# Patient Record
Sex: Female | Born: 1995 | Race: Black or African American | Hispanic: No | Marital: Married | Smoking: Former smoker
Health system: Southern US, Community
[De-identification: ages and names within clinical notes are randomized; demographics above are authoritative.]

## PROBLEM LIST (undated history)

## (undated) ENCOUNTER — Inpatient Hospital Stay (HOSPITAL_COMMUNITY): Payer: Self-pay

## (undated) DIAGNOSIS — N390 Urinary tract infection, site not specified: Secondary | ICD-10-CM

## (undated) HISTORY — DX: Urinary tract infection, site not specified: N39.0

## (undated) HISTORY — PX: NO PAST SURGERIES: SHX2092

---

## 2018-03-02 ENCOUNTER — Emergency Department (HOSPITAL_COMMUNITY): Payer: BLUE CROSS/BLUE SHIELD

## 2018-03-02 ENCOUNTER — Encounter (HOSPITAL_COMMUNITY): Payer: Self-pay | Admitting: *Deleted

## 2018-03-02 ENCOUNTER — Emergency Department (HOSPITAL_COMMUNITY)
Admission: EM | Admit: 2018-03-02 | Discharge: 2018-03-02 | Disposition: A | Discharge status: Home / Self Care | Payer: BLUE CROSS/BLUE SHIELD | Attending: Emergency Medicine | Admitting: Emergency Medicine

## 2018-03-02 ENCOUNTER — Other Ambulatory Visit: Payer: Self-pay

## 2018-03-02 DIAGNOSIS — R102 Pelvic and perineal pain: Secondary | ICD-10-CM | POA: Insufficient documentation

## 2018-03-02 DIAGNOSIS — O9989 Other specified diseases and conditions complicating pregnancy, childbirth and the puerperium: Secondary | ICD-10-CM | POA: Diagnosis present

## 2018-03-02 DIAGNOSIS — O208 Other hemorrhage in early pregnancy: Secondary | ICD-10-CM | POA: Insufficient documentation

## 2018-03-02 DIAGNOSIS — O418X10 Other specified disorders of amniotic fluid and membranes, first trimester, not applicable or unspecified: Secondary | ICD-10-CM

## 2018-03-02 DIAGNOSIS — N760 Acute vaginitis: Secondary | ICD-10-CM

## 2018-03-02 DIAGNOSIS — Z3A01 Less than 8 weeks gestation of pregnancy: Secondary | ICD-10-CM | POA: Diagnosis not present

## 2018-03-02 DIAGNOSIS — Z87891 Personal history of nicotine dependence: Secondary | ICD-10-CM | POA: Diagnosis not present

## 2018-03-02 DIAGNOSIS — B9689 Other specified bacterial agents as the cause of diseases classified elsewhere: Secondary | ICD-10-CM

## 2018-03-02 DIAGNOSIS — O2331 Infections of other parts of urinary tract in pregnancy, first trimester: Secondary | ICD-10-CM | POA: Insufficient documentation

## 2018-03-02 DIAGNOSIS — O468X1 Other antepartum hemorrhage, first trimester: Secondary | ICD-10-CM

## 2018-03-02 LAB — CBC WITH DIFFERENTIAL/PLATELET
Abs Immature Granulocytes: 0.02 10*3/uL (ref 0.00–0.07)
Basophils Absolute: 0.1 10*3/uL (ref 0.0–0.1)
Basophils Relative: 1 %
EOS PCT: 3 %
Eosinophils Absolute: 0.2 10*3/uL (ref 0.0–0.5)
HCT: 42 % (ref 36.0–46.0)
Hemoglobin: 12.6 g/dL (ref 12.0–15.0)
Immature Granulocytes: 0 %
Lymphocytes Relative: 26 %
Lymphs Abs: 2.3 10*3/uL (ref 0.7–4.0)
MCH: 25.6 pg — AB (ref 26.0–34.0)
MCHC: 30 g/dL (ref 30.0–36.0)
MCV: 85.4 fL (ref 80.0–100.0)
MONO ABS: 1.3 10*3/uL — AB (ref 0.1–1.0)
Monocytes Relative: 15 %
Neutro Abs: 4.9 10*3/uL (ref 1.7–7.7)
Neutrophils Relative %: 55 %
Platelets: 244 10*3/uL (ref 150–400)
RBC: 4.92 MIL/uL (ref 3.87–5.11)
RDW: 13.3 % (ref 11.5–15.5)
WBC: 8.8 10*3/uL (ref 4.0–10.5)
nRBC: 0 % (ref 0.0–0.2)

## 2018-03-02 LAB — WET PREP, GENITAL
SPERM: NONE SEEN
Trich, Wet Prep: NONE SEEN
Yeast Wet Prep HPF POC: NONE SEEN

## 2018-03-02 LAB — BASIC METABOLIC PANEL
Anion gap: 10 (ref 5–15)
BUN: 5 mg/dL — ABNORMAL LOW (ref 6–20)
CHLORIDE: 103 mmol/L (ref 98–111)
CO2: 21 mmol/L — ABNORMAL LOW (ref 22–32)
Calcium: 9 mg/dL (ref 8.9–10.3)
Creatinine, Ser: 0.76 mg/dL (ref 0.44–1.00)
GFR calc Af Amer: >60 mL/min (ref >60)
GFR calc non Af Amer: >60 mL/min (ref >60)
Glucose, Bld: 98 mg/dL (ref 70–99)
Potassium: 3.4 mmol/L — ABNORMAL LOW (ref 3.5–5.1)
Sodium: 134 mmol/L — ABNORMAL LOW (ref 135–145)

## 2018-03-02 LAB — URINALYSIS, ROUTINE W REFLEX MICROSCOPIC
BILIRUBIN URINE: NEGATIVE
Glucose, UA: NEGATIVE mg/dL
Ketones, ur: NEGATIVE mg/dL
Leukocytes, UA: NEGATIVE
Nitrite: NEGATIVE
Protein, ur: NEGATIVE mg/dL
Specific Gravity, Urine: 1.008 (ref 1.005–1.030)
pH: 6 (ref 5.0–8.0)

## 2018-03-02 LAB — ABO/RH: ABO/RH(D): A POS

## 2018-03-02 LAB — HCG, QUANTITATIVE, PREGNANCY: hCG, Beta Chain, Quant, S: 31614 m[IU]/mL — ABNORMAL HIGH (ref <5)

## 2018-03-02 MED ORDER — METRONIDAZOLE 0.75 % VA GEL: 1.0000 | Freq: Every day | VAGINAL | 0 refills | Status: AC

## 2018-03-02 NOTE — ED Notes (Signed)
Pt still in ultrasound.

## 2018-03-02 NOTE — Discharge Instructions (Signed)
Start taking a prenatal vitamin every day. Follow-up with OB/GYN for further management of your pregnancy. Take metrogel daily for the next 5 days.  Return to the ER or the [facility] if you develop severe worsening pain, bleeding, or any new, worsening, or concerning symptoms.

## 2018-03-02 NOTE — ED Triage Notes (Signed)
Patient states she was in her kitchen last pm and slipped and fell hitting her head no loc , states she had a heachache and took some Ibuprofen, states she woke up this am with vaginal bleeding and lower abd. Cramping. States she took  A home preg. Test last week  And it was postivive

## 2018-03-02 NOTE — ED Provider Notes (Signed)
[facility] EMERGENCY DEPARTMENT Provider Note   CSN: [id number] Arrival date & time: 03/02/18  4098     History   Chief Complaint Chief Complaint  Patient presents with  . Fall  . Vaginal Bleeding  . Possible Pregnancy    HPI Jennifer Bush is a 23 y.o. female presenting for evaluation of vaginal bleeding, fall, and cramping.  Patient states last night she slipped while wearing socks on the floor, fell back and hit the back of her head.  She denies loss of consciousness.  She is not on blood thinners.  She took some ibuprofen, had improvement of her headache.  She currently denies any head pain.  She denies vision changes, slurred speech, neck pain, back pain, numbness/tingling, nausea, or vomiting. Patient states she took 2 home pregnancy tests on the third, both of which were positive.  When she took a pregnancy test just before Christmas, it was negative.  This is patient's first pregnancy.  Today, she had some lower abdominal cramping.  Cramping is intermittent, and improving.  She has not taken anything for it.  She also woke up with vaginal bleeding.  She states it was moderate, although improved prior to coming to the ER.  She denies fevers, chills, chest pain, shortness breath, upper abdominal pain, or urinary symptoms.  She has no medical problems, takes no medications daily.  She has not followed up with an OB/GYN, but was working on scheduling a follow-up appointment.  HPI  History reviewed. No pertinent past medical history.  There are no active problems to display for this patient.   History reviewed. No pertinent surgical history.   OB History   No obstetric history on file.      Home Medications    Prior to Admission medications   Medication Sig Start Date End Date Taking? Authorizing Provider  metroNIDAZOLE (METROGEL VAGINAL) 0.75 % vaginal gel Place 1 Applicatorful vaginally daily for 5 days. 03/02/18 03/07/18  Advik Weatherspoon, PA-C     Family History No family history on file.  Social History Social History   Tobacco Use  . Smoking status: Former Games developer  . Smokeless tobacco: Never Used  Substance Use Topics  . Alcohol use: Not Currently  . Drug use: Never     Allergies   Patient has no allergy information on record.   Review of Systems Review of Systems  Genitourinary: Positive for pelvic pain (Low abdominal cramping, improving) and vaginal bleeding (Improving). Negative for dysuria, frequency, hematuria and vaginal discharge.  Neurological: Positive for headaches (Resolved).  All other systems reviewed and are negative.    Physical Exam Updated Vital Signs BP (!) 108/50 (BP Location: Right Arm)   Pulse 77   Temp 98.6 F (37 C) (Oral)   Resp 15   Ht 5\' 3"  (1.6 m)   Wt 72.6 kg   SpO2 100%   BMI 28.34 kg/m   Physical Exam Vitals signs and nursing note reviewed. Exam conducted with a chaperone present.  Constitutional:      General: She is not in acute distress.    Appearance: She is well-developed.     Comments: Sitting comfortably in the bed in no acute distress  HENT:     Head: Normocephalic and atraumatic.     Comments: No signs of head trauma.  No lacerations or injuries. Eyes:     Extraocular Movements: Extraocular movements intact.     Conjunctiva/sclera: Conjunctivae normal.     Pupils: Pupils are equal, round, and  reactive to light.     Comments: EOMI and PERRLA.  Neck:     Musculoskeletal: Normal range of motion and neck supple.     Comments: No tenderness palpation midline C-spine. Cardiovascular:     Rate and Rhythm: Normal rate and regular rhythm.  Pulmonary:     Effort: Pulmonary effort is normal. No respiratory distress.     Breath sounds: Normal breath sounds. No wheezing.  Abdominal:     General: Bowel sounds are normal. There is no distension.     Palpations: Abdomen is soft. There is no mass.     Tenderness: There is no abdominal tenderness. There is no guarding  or rebound.     Comments: No tenderness palpation the abdomen.  Soft without rigidity, guarding,, distention.  Negative rebound.  Genitourinary:    Exam position: Supine.     Labia:        Right: No tenderness or lesion.        Left: No tenderness or lesion.      Vagina: Normal.     Cervix: Cervical bleeding present. No cervical motion tenderness or friability.     Uterus: Not fixed and not tender.      Adnexa:        Right: No tenderness.         Left: No tenderness.       Comments: Minimal to no bleeding coming from the cervix.  No adnexal tenderness or CMT.  No obvious discharge. Musculoskeletal: Normal range of motion.  Skin:    General: Skin is warm and dry.  Neurological:     Mental Status: She is alert and oriented to person, place, and time.      ED Treatments / Results  Labs (all labs ordered are listed, but only abnormal results are displayed) Labs Reviewed  WET PREP, GENITAL - Abnormal; Notable for the following components:      Result Value   Clue Cells Wet Prep HPF POC PRESENT (*)    WBC, Wet Prep HPF POC FEW (*)    All other components within normal limits  URINALYSIS, ROUTINE W REFLEX MICROSCOPIC - Abnormal; Notable for the following components:   APPearance HAZY (*)    Hgb urine dipstick MODERATE (*)    Bacteria, UA RARE (*)    All other components within normal limits  CBC WITH DIFFERENTIAL/PLATELET - Abnormal; Notable for the following components:   MCH 25.6 (*)    Monocytes Absolute 1.3 (*)    All other components within normal limits  BASIC METABOLIC PANEL - Abnormal; Notable for the following components:   Sodium 134 (*)    Potassium 3.4 (*)    CO2 21 (*)    BUN 5 (*)    All other components within normal limits  HCG, QUANTITATIVE, PREGNANCY - Abnormal; Notable for the following components:   hCG, Beta Chain, Quant, S 31,614 (*)    All other components within normal limits  URINE CULTURE  ABO/RH  GC/CHLAMYDIA PROBE AMP () NOT AT [facility]     EKG None  Radiology US Ob Comp < 14 Wks  Result Date: 03/02/2018 CLINICAL DATA:  Vaginal bleeding for 1 day in early pregnancy EXAM: OBSTETRIC <14 WK [place] AND TRANSVAGINAL OB US TECHNIQUE: Both transabdominal and transvaginal ultrasound examinations were performed for complete evaluation of the gestation as well as the maternal uterus, adnexal regions, and pelvic cul-de-sac. Transvaginal technique was performed to assess early pregnancy. COMPARISON:  None FINDINGS: Intrauterine gestational sac:  Present, single Yolk sac:  Present Embryo:  Present Cardiac Activity: Present Heart Rate: Adequate M-mode tracing of the fetal cardiac rate could not be obtained CRL:  3 mm   5 w   6 d                  [place] EDC: 11/07/2018 Subchorionic hemorrhage: Moderate to large subchronic hemorrhage present Maternal uterus/adnexae: Ovaries unremarkable. No free fluid. No adnexal masses. IMPRESSION: Single live intrauterine gestation is identified at 5 weeks 6 days EGA by crown-rump length. Moderate to large subchronic hemorrhage. Electronically Signed   By: Ulyses Southward M.D.   On: 03/02/2018 12:21   US Ob Transvaginal  Result Date: 03/02/2018 CLINICAL DATA:  Vaginal bleeding for 1 day in early pregnancy EXAM: OBSTETRIC <14 WK [place] AND TRANSVAGINAL OB US TECHNIQUE: Both transabdominal and transvaginal ultrasound examinations were performed for complete evaluation of the gestation as well as the maternal uterus, adnexal regions, and pelvic cul-de-sac. Transvaginal technique was performed to assess early pregnancy. COMPARISON:  None FINDINGS: Intrauterine gestational sac: Present, single Yolk sac:  Present Embryo:  Present Cardiac Activity: Present Heart Rate: Adequate M-mode tracing of the fetal cardiac rate could not be obtained CRL:  3 mm   5 w   6 d                  [place] EDC: 11/07/2018 Subchorionic hemorrhage: Moderate to large subchronic hemorrhage present Maternal uterus/adnexae: Ovaries unremarkable. No free fluid. No adnexal  masses. IMPRESSION: Single live intrauterine gestation is identified at 5 weeks 6 days EGA by crown-rump length. Moderate to large subchronic hemorrhage. Electronically Signed   By: Ulyses Southward M.D.   On: 03/02/2018 12:21    Procedures Procedures (including critical care time)  Medications Ordered in ED Medications - No data to display   Initial Impression / Assessment and Plan / ED Course  I have reviewed the triage vital signs and the nursing notes.  Pertinent labs & imaging results that were available during my care of the patient were reviewed by me and considered in my medical decision making (see chart for details).     Presenting for evaluation after a fall, vaginal bleeding, and cramping.  On exam, patient is neurovascularly intact.  Headache resolved.  As such, low suspicion for intracranial injury or skull fracture.  I do not believe she needs CT scan today.  Regarding patient's vaginal bleeding, cramping, and pregnancy, will obtain labs, urine, and perform pelvic exam.  Pelvic exam reassuring, no tenderness.  Lower suspicion for ectopic.  However, patient has not had a confirmed IUP on ultrasound.  As he was having pain and vaginal bleeding, will obtain ultrasound for further evaluation.  Labs reassuring, hemoglobin stable.  HCG consistent with pregnancy.  Wet prep with clue cells.  UA without infection.  Ultrasound shows a single, live, approximately 5-week, IUP.  Ultrasound also shows subchorionic hemorrhage.  Discussed findings with patient.  Discussed importance of follow-up with OB/GYN.  Discussed starting prenatal vitamins.  Patient encouraged not to take NSAIDs, use Tylenol as needed for pain.  Will give MetroGel for BV.  At this time, patient appears safe for discharge.  Return precautions given.  Patient states she understands and agrees to plan.   Final Clinical Impressions(s) / ED Diagnoses   Final diagnoses:  BV (bacterial vaginosis)  Subchorionic hemorrhage of  placenta in first trimester, single or unspecified fetus    ED Discharge Orders  Ordered    metroNIDAZOLE (METROGEL VAGINAL) 0.75 % vaginal gel  Daily     03/02/18 1232           Alveria ApleyCaccavale, Merline Perkin, PA-C 03/02/18 1654    Mesner, Barbara CowerJason, MD 03/03/18 1651

## 2018-03-02 NOTE — ED Notes (Signed)
Esignature pad not available. Pt received d/c instructions and prescription. Pt agreeable to discharge.

## 2018-03-03 ENCOUNTER — Other Ambulatory Visit: Payer: Self-pay

## 2018-03-03 ENCOUNTER — Encounter (HOSPITAL_COMMUNITY): Payer: Self-pay | Admitting: *Deleted

## 2018-03-03 ENCOUNTER — Inpatient Hospital Stay (HOSPITAL_COMMUNITY)
Admission: AD | Admit: 2018-03-03 | Discharge: 2018-03-03 | Disposition: A | Discharge status: Home / Self Care | Payer: BLUE CROSS/BLUE SHIELD | Attending: Family Medicine | Admitting: Family Medicine

## 2018-03-03 DIAGNOSIS — O468X1 Other antepartum hemorrhage, first trimester: Secondary | ICD-10-CM | POA: Diagnosis not present

## 2018-03-03 DIAGNOSIS — O209 Hemorrhage in early pregnancy, unspecified: Secondary | ICD-10-CM

## 2018-03-03 DIAGNOSIS — Z87891 Personal history of nicotine dependence: Secondary | ICD-10-CM | POA: Diagnosis not present

## 2018-03-03 DIAGNOSIS — Z3A01 Less than 8 weeks gestation of pregnancy: Secondary | ICD-10-CM | POA: Insufficient documentation

## 2018-03-03 DIAGNOSIS — O418X10 Other specified disorders of amniotic fluid and membranes, first trimester, not applicable or unspecified: Secondary | ICD-10-CM | POA: Diagnosis present

## 2018-03-03 LAB — URINALYSIS, ROUTINE W REFLEX MICROSCOPIC
BILIRUBIN URINE: NEGATIVE
Bacteria, UA: NONE SEEN
Glucose, UA: NEGATIVE mg/dL
Ketones, ur: 5 mg/dL — AB
Leukocytes, UA: NEGATIVE
Nitrite: NEGATIVE
Protein, ur: NEGATIVE mg/dL
Specific Gravity, Urine: 1.001 — ABNORMAL LOW (ref 1.005–1.030)
pH: 6 (ref 5.0–8.0)

## 2018-03-03 LAB — URINE CULTURE

## 2018-03-03 LAB — GC/CHLAMYDIA PROBE AMP (~~LOC~~) NOT AT ARMC
Chlamydia: NEGATIVE
Neisseria Gonorrhea: NEGATIVE

## 2018-03-03 MED ORDER — VITAFOL FE+ 90-1-200 & 50 MG PO CPPK: 3.0000 | ORAL_CAPSULE | Freq: Every day | ORAL | 12 refills | Status: AC

## 2018-03-03 MED ORDER — METOCLOPRAMIDE HCL 10 MG PO TABS: 10.0000 mg | ORAL_TABLET | Freq: Four times a day (QID) | ORAL | 0 refills | Status: AC

## 2018-03-03 MED ORDER — PROMETHAZINE HCL 12.5 MG PO TABS: 12.5000 mg | ORAL_TABLET | Freq: Four times a day (QID) | ORAL | 1 refills | Status: AC | PRN

## 2018-03-03 NOTE — Discharge Instructions (Signed)
Choose from one of the providers below to establish prenatal care  Center for [facility] Prenatal Care Providers          Center for [facility] Healthcare @ [facility]   Phone: [phone]  Center for [facility] @ [facility]   Phone: [phone]  [facility] For [facility] @[place]  [facility]       Phone: [phone]            Center for [facility] @ [place]     Phone: [phone]          Center for [facility] @ [employer]   Phone: [phone]  Center for [facility] @ [facility]  Phone: [phone]     Family [address] [place])  Phone: [phone]  [facility] Ob/Gyn Providers    [facility]       Phone: [phone]  [facility]    Phone: [phone]   [facility]    Phone: [phone]  [facility]    Phone: [phone]  [facility]    Phone: [phone]  [facility] Department-Family Planning       Phone: [phone]   [facility] Department-Maternity  Phone: [phone]  [facility]    Phone: [phone]  [facility]   Phone: [phone]  [facility]      Phone: [phone]  [facility]    Phone: [phone]

## 2018-03-03 NOTE — MAU Provider Note (Signed)
History     CSN: [id number]  Arrival date and time: 03/03/18 1401   First Provider Initiated Contact with Patient 03/03/18 1500      Chief Complaint  Patient presents with  . Fall   HPI  Ms.  Jennifer Bush is a 23 y.o. year old G1P0 female at [redacted]w[redacted]d weeks gestation who presents to MAU reporting vaginal bleeding with intermittent cramping today. She reports that she fell flat on her back also hitting the back of her head on Monday 03/01/2018. She began to have vaginal bleeding. She was seen at [facility] on 03/02/2018. She had an U/S and was told that she had a viable IUP sized 5.6 wks.  Past Medical History:  Diagnosis Date  . Medical history non-contributory     Past Surgical History:  Procedure Laterality Date  . NO PAST SURGERIES      Family History  Problem Relation Age of Onset  . Hypertension Father   . Hypertension Maternal Grandmother     Social History   Tobacco Use  . Smoking status: Former Games developer  . Smokeless tobacco: Never Used  Substance Use Topics  . Alcohol use: Not Currently  . Drug use: Never    Allergies: No Known Allergies  Medications Prior to Admission  Medication Sig Dispense Refill Last Dose  . metroNIDAZOLE (METROGEL VAGINAL) 0.75 % vaginal gel Place 1 Applicatorful vaginally daily for 5 days. 70 g 0     Review of Systems  Constitutional: Negative.   HENT: Negative.   Eyes: Negative.   Respiratory: Negative.   Cardiovascular: Negative.   Gastrointestinal: Negative.   Endocrine: Negative.   Genitourinary: Positive for vaginal bleeding.  Musculoskeletal: Negative.   Skin: Negative.   Allergic/Immunologic: Negative.   Neurological: Negative.   Hematological: Negative.   Psychiatric/Behavioral: Negative.    Physical Exam   Blood pressure 130/70, pulse 88, temperature 98.5 F (36.9 C), temperature source Oral, resp. rate 18, height 5\' 3"  (1.6 m), weight 72.1 kg, last menstrual period 01/18/2018, SpO2 98 %.  Physical Exam  Nursing note  and vitals reviewed. Constitutional: She is oriented to person, place, and time. She appears well-developed and well-nourished.  HENT:  Head: Normocephalic and atraumatic.  Eyes: Pupils are equal, round, and reactive to light.  Neck: Normal range of motion.  Cardiovascular: Normal rate and regular rhythm.  Respiratory: Effort normal.  GI: Soft.  Genitourinary:    Genitourinary Comments: Uterus: non-tender, SE: cervix is smooth, pink, no lesions, small amt of dark, red blood, no active bleeding from cervical os, closed/long/firm, no CMT or friability, no adnexal tenderness    Musculoskeletal: Normal range of motion.  Neurological: She is alert and oriented to person, place, and time.  Skin: Skin is warm and dry.  Psychiatric: She has a normal mood and affect. Her behavior is normal. Judgment and thought content normal.    MAU Course  Procedures  MDM After review of U/S and labs from [facility], explained [facility] to patient and the increased r/f threatened SAB, reassurance given that the amount of VB seen on exam is c/w [facility].   Results for orders placed or performed during the hospital encounter of 03/03/18 (from the past 24 hour(s))  Urinalysis, Routine w reflex microscopic     Status: Abnormal   Collection Time: 03/03/18  3:05 PM  Result Value Ref Range   Color, Urine COLORLESS (A) YELLOW   APPearance CLEAR CLEAR   Specific Gravity, Urine 1.001 (L) 1.005 - 1.030   pH 6.0 5.0 -  8.0   Glucose, UA NEGATIVE NEGATIVE mg/dL   Hgb urine dipstick MODERATE (A) NEGATIVE   Bilirubin Urine NEGATIVE NEGATIVE   Ketones, ur 5 (A) NEGATIVE mg/dL   Protein, ur NEGATIVE NEGATIVE mg/dL   Nitrite NEGATIVE NEGATIVE   Leukocytes, UA NEGATIVE NEGATIVE   RBC / HPF 0-5 0 - 5 RBC/hpf   WBC, UA 0-5 0 - 5 WBC/hpf   Bacteria, UA NONE SEEN NONE SEEN    Assessment and Plan  Subchorionic hematoma in first trimester, single or unspecified fetus  - Information provided on [facility] & threatened miscarriage   Vaginal  bleeding in pregnancy, first trimester  - Information provided on VB in 1st trimester of pregnancy - Return to care   If you have heavier bleeding that soaks through more that 2 pads per hour for an hour or more  If you bleed so much that you feel like you might pass out or you do pass out  If you have significant abdominal pain that is not improved with Tylenol   If you develop a fever > 100.5   - Discharge home - List of local OB providers given - Advised to schedule [facility] ASAP - Patient verbalized an understanding of the plan of care and agrees.   Raelyn Mora, MSN, CNM 03/03/2018, 3:00 PM

## 2018-03-03 NOTE — Progress Notes (Deleted)
Cath urine specimen otained

## 2018-03-03 NOTE — MAU Note (Signed)
Presents with c/o VB with intermittent abdominal cramping.  Pt reports s/p fall on Monday, landed on back.  Reports seen @ [facility] ED yesterday, U/S done, subchorionic hemorrhage noted.

## 2018-03-10 ENCOUNTER — Other Ambulatory Visit: Payer: Self-pay

## 2018-03-10 ENCOUNTER — Inpatient Hospital Stay (HOSPITAL_COMMUNITY): Payer: BLUE CROSS/BLUE SHIELD

## 2018-03-10 ENCOUNTER — Encounter (HOSPITAL_COMMUNITY): Payer: Self-pay | Admitting: *Deleted

## 2018-03-10 ENCOUNTER — Inpatient Hospital Stay (HOSPITAL_COMMUNITY)
Admission: AD | Admit: 2018-03-10 | Discharge: 2018-03-10 | Disposition: A | Discharge status: Home / Self Care | Payer: BLUE CROSS/BLUE SHIELD | Attending: Obstetrics & Gynecology | Admitting: Obstetrics & Gynecology

## 2018-03-10 DIAGNOSIS — O468X1 Other antepartum hemorrhage, first trimester: Secondary | ICD-10-CM

## 2018-03-10 DIAGNOSIS — O209 Hemorrhage in early pregnancy, unspecified: Secondary | ICD-10-CM

## 2018-03-10 DIAGNOSIS — Z3A01 Less than 8 weeks gestation of pregnancy: Secondary | ICD-10-CM | POA: Diagnosis not present

## 2018-03-10 DIAGNOSIS — O418X10 Other specified disorders of amniotic fluid and membranes, first trimester, not applicable or unspecified: Secondary | ICD-10-CM | POA: Diagnosis not present

## 2018-03-10 DIAGNOSIS — Z87891 Personal history of nicotine dependence: Secondary | ICD-10-CM | POA: Insufficient documentation

## 2018-03-10 DIAGNOSIS — O208 Other hemorrhage in early pregnancy: Secondary | ICD-10-CM | POA: Insufficient documentation

## 2018-03-10 LAB — URINALYSIS, ROUTINE W REFLEX MICROSCOPIC
Bacteria, UA: NONE SEEN
Bilirubin Urine: NEGATIVE
Glucose, UA: NEGATIVE mg/dL
Ketones, ur: NEGATIVE mg/dL
Leukocytes, UA: NEGATIVE
Nitrite: NEGATIVE
PH: 7 (ref 5.0–8.0)
Protein, ur: NEGATIVE mg/dL
RBC / HPF: >50 RBC/hpf — ABNORMAL HIGH (ref 0–5)
Specific Gravity, Urine: 1.02 (ref 1.005–1.030)

## 2018-03-10 MED ORDER — OXYCODONE-ACETAMINOPHEN 5-325 MG PO TABS: 2.0000 | ORAL_TABLET | ORAL | 0 refills | Status: AC | PRN

## 2018-03-10 NOTE — Discharge Instructions (Signed)
I have called and left a message at your OB's office to get you seen in 1 week, but you call tomorrow as well.

## 2018-03-10 NOTE — MAU Provider Note (Signed)
History     CSN: [id number]  Arrival date and time: 03/10/18 1641   First Provider Initiated Contact with Patient 03/10/18 1733      Chief Complaint  Patient presents with  . Back Pain  . Vaginal Bleeding   HPI  Ms.  Jennifer Bush is a 23 y.o. year old G1P0 female at [redacted]w[redacted]d weeks gestation who presents to MAU reporting feeling a gush "a lot" of blood come out of her vagina while lying down this afternoon about 1500, RLQ pain that started after the VB, now she is having lower back pain. She has a known SCH. She plans to begin [facility] with [facility]. She states she went by their office before coming to MAU today and she was told someone will call her tomorrow or Friday to schedule an appt.  Past Medical History:  Diagnosis Date  . Medical history non-contributory     Past Surgical History:  Procedure Laterality Date  . NO PAST SURGERIES      Family History  Problem Relation Age of Onset  . Hypertension Father   . Hypertension Maternal Grandmother     Social History   Tobacco Use  . Smoking status: Former Games developer  . Smokeless tobacco: Never Used  Substance Use Topics  . Alcohol use: Not Currently  . Drug use: Never    Allergies: No Known Allergies  No medications prior to admission.    Review of Systems Review of Systems  Constitutional: Negative.   HENT: Negative.   Eyes: Negative.   Respiratory: Negative.   Cardiovascular: Negative.   Gastrointestinal: Negative.   Genitourinary:       RT lower pelvic pain, "a lot" of VB  Musculoskeletal: Positive for back pain (lower).  Skin: Negative.   Neurological: Negative.   Endo/Heme/Allergies: Negative.   Psychiatric/Behavioral: Negative.    Physical Exam   Blood pressure 112/67, pulse 65, temperature 98.7 F (37.1 C), temperature source Oral, resp. rate 18, weight 72 kg, last menstrual period 01/18/2018, SpO2 100 %.  Physical Exam Physical Exam Constitutional:      Appearance: Normal appearance.   Genitourinary:     Vulva normal.     Genitourinary Comments: SE: medium blood clot removed from vaginal vault, no active bleeding from cervical os  HENT:     Head: Normocephalic and atraumatic.     Nose: Nose normal.     Mouth/Throat:     Mouth: Mucous membranes are moist.  Eyes:     Pupils: Pupils are equal, round, and reactive to light.  Neck:     Musculoskeletal: Normal range of motion.  Cardiovascular:     Rate and Rhythm: Normal rate.  Pulmonary:     Effort: Pulmonary effort is normal.  Abdominal:     Palpations: Abdomen is soft.  Musculoskeletal: Normal range of motion.  Neurological:     Mental Status: She is alert and oriented to person, place, and time.  Skin:    General: Skin is warm and dry.  Psychiatric:        Mood and Affect: Mood normal.        Behavior: Behavior normal.        Thought Content: Thought content normal.        Judgment: Judgment normal.    MAU Course  Procedures  MDM [facility] OB <14 wks U/S with TV  Results for orders placed or performed during the hospital encounter of 03/10/18 (from the past 24 hour(s))  Urinalysis, Routine w reflex microscopic  Status: Abnormal   Collection Time: 03/10/18  5:12 PM  Result Value Ref Range   Color, Urine YELLOW YELLOW   APPearance HAZY (A) CLEAR   Specific Gravity, Urine 1.020 1.005 - 1.030   pH 7.0 5.0 - 8.0   Glucose, UA NEGATIVE NEGATIVE mg/dL   Hgb urine dipstick LARGE (A) NEGATIVE   Bilirubin Urine NEGATIVE NEGATIVE   Ketones, ur NEGATIVE NEGATIVE mg/dL   Protein, ur NEGATIVE NEGATIVE mg/dL   Nitrite NEGATIVE NEGATIVE   Leukocytes, UA NEGATIVE NEGATIVE   RBC / HPF >50 (H) 0 - 5 RBC/hpf   WBC, UA 0-5 0 - 5 WBC/hpf   Bacteria, UA NONE SEEN NONE SEEN   Squamous Epithelial / LPF 0-5 0 - 5   Mucus PRESENT     US Ob Transvaginal  Result Date: 03/10/2018 CLINICAL DATA:  Vaginal bleeding. Pregnant patient. Patient was 5 weeks and 6 days pregnant based on the pelvic ultrasound dated 03/02/2018.  EXAM: OBSTETRIC <14 WK [place] AND TRANSVAGINAL OB US TECHNIQUE: Both transabdominal and transvaginal ultrasound examinations were performed for complete evaluation of the gestation as well as the maternal uterus, adnexal regions, and pelvic cul-de-sac. Transvaginal technique was performed to assess early pregnancy. COMPARISON:  03/02/2018 FINDINGS: Intrauterine gestational sac: Single Yolk sac:  Visualized. Embryo: Visualized. Embryo lies along 1 margin of the gestational sac where the sac appears compressed by the subchronic hemorrhage. Cardiac Activity: Visualized Heart Rate: 77 bpm CRL:  7.2 mm   6 w   4 d                  [place] EDC: 10/30/2018 Subchorionic hemorrhage:  Large Maternal uterus/adnexae: No uterine masses. Ovaries and adnexa are unremarkable. No abnormal pelvic free fluid. IMPRESSION: 1. Findings concerning for impending pregnancy failure. Since the prior exam there has been some interval growth of the embryo, although less than 8 days of interval growth. 2. Although fetal cardiac activity is visualized, the heart rate is low, measured at 77 beats per minute. 3. Large subchronic hemorrhage is again noted. 4. Recommend short-term follow-up with repeat ultrasound in 7-10 days to assess for normal pregnancy progression. Electronically Signed   By: Amie Portland M.D.   On: 03/10/2018 18:11     Assessment and Plan  Subchorionic hematoma in first trimester, single or unspecified fetus  - Discussed the possibility of SAB and signs to look for - Information provided on threatened miscarriage - Advised to increase rest while out of work through the weekend  - Discharge patient - Advised to call [facility] [facility] tomorrow to get scheduled for F/U in 7-10 days - Patient verbalized an understanding of the plan of care and agrees.    Raelyn Mora, MSN, CNM 03/10/2018, 8:14 PM

## 2018-03-10 NOTE — MAU Note (Signed)
Was laying down, felt like a gush a come out of vagina~ 1500.  Was a lot of blood. Followed by pain in RLQ< now in low back. Is 7wks preg with known subchorionic hemmorhage

## 2018-03-15 ENCOUNTER — Other Ambulatory Visit: Payer: Self-pay

## 2018-03-15 ENCOUNTER — Inpatient Hospital Stay (HOSPITAL_COMMUNITY)
Admission: AD | Admit: 2018-03-15 | Discharge: 2018-03-15 | Discharge status: Against Medical Advice | Payer: BLUE CROSS/BLUE SHIELD | Attending: Obstetrics and Gynecology | Admitting: Obstetrics and Gynecology

## 2018-03-15 DIAGNOSIS — Z5321 Procedure and treatment not carried out due to patient leaving prior to being seen by health care provider: Secondary | ICD-10-CM | POA: Diagnosis not present

## 2018-03-15 DIAGNOSIS — O468X1 Other antepartum hemorrhage, first trimester: Secondary | ICD-10-CM

## 2018-03-15 DIAGNOSIS — O208 Other hemorrhage in early pregnancy: Secondary | ICD-10-CM | POA: Insufficient documentation

## 2018-03-15 DIAGNOSIS — O418X10 Other specified disorders of amniotic fluid and membranes, first trimester, not applicable or unspecified: Secondary | ICD-10-CM

## 2018-03-15 NOTE — MAU Note (Signed)
#  3 not in lobby 

## 2018-03-15 NOTE — MAU Note (Signed)
Dx with subchorionic hemorrhage.  Was told to follow up to see if ok to go back to work.  Is still having  Small amt of brown bleeding, still cramping.

## 2018-03-15 NOTE — MAU Note (Signed)
#  2 not in lobby 

## 2018-03-15 NOTE — Progress Notes (Signed)
1st call 1410-pt not in lobby.

## 2018-03-23 ENCOUNTER — Encounter (HOSPITAL_COMMUNITY): Payer: Self-pay

## 2018-04-20 ENCOUNTER — Other Ambulatory Visit (HOSPITAL_COMMUNITY): Payer: Self-pay | Admitting: Obstetrics and Gynecology

## 2018-04-20 DIAGNOSIS — Z3A13 13 weeks gestation of pregnancy: Secondary | ICD-10-CM

## 2018-04-20 DIAGNOSIS — Z369 Encounter for antenatal screening, unspecified: Secondary | ICD-10-CM

## 2018-04-21 ENCOUNTER — Encounter (HOSPITAL_COMMUNITY): Payer: Self-pay

## 2018-04-21 ENCOUNTER — Ambulatory Visit (HOSPITAL_COMMUNITY): Payer: BLUE CROSS/BLUE SHIELD | Admitting: *Deleted

## 2018-04-21 ENCOUNTER — Ambulatory Visit (HOSPITAL_COMMUNITY): Payer: BLUE CROSS/BLUE SHIELD

## 2018-04-21 ENCOUNTER — Ambulatory Visit (HOSPITAL_COMMUNITY)
Admission: RE | Admit: 2018-04-21 | Discharge: 2018-04-21 | Disposition: A | Discharge status: Home / Self Care | Payer: BLUE CROSS/BLUE SHIELD | Source: Ambulatory Visit | Attending: Obstetrics and Gynecology | Admitting: Obstetrics and Gynecology

## 2018-04-21 DIAGNOSIS — Z369 Encounter for antenatal screening, unspecified: Secondary | ICD-10-CM | POA: Diagnosis present

## 2018-04-21 DIAGNOSIS — O468X1 Other antepartum hemorrhage, first trimester: Secondary | ICD-10-CM | POA: Diagnosis present

## 2018-04-21 DIAGNOSIS — Z3682 Encounter for antenatal screening for nuchal translucency: Secondary | ICD-10-CM

## 2018-04-21 DIAGNOSIS — Z3A13 13 weeks gestation of pregnancy: Secondary | ICD-10-CM | POA: Diagnosis not present

## 2018-04-21 DIAGNOSIS — O418X10 Other specified disorders of amniotic fluid and membranes, first trimester, not applicable or unspecified: Secondary | ICD-10-CM | POA: Diagnosis present

## 2018-04-21 DIAGNOSIS — O4591 Premature separation of placenta, unspecified, first trimester: Secondary | ICD-10-CM

## 2018-04-27 ENCOUNTER — Other Ambulatory Visit (HOSPITAL_COMMUNITY): Payer: Self-pay | Admitting: Obstetrics and Gynecology

## 2018-04-27 ENCOUNTER — Other Ambulatory Visit (HOSPITAL_COMMUNITY): Payer: BLUE CROSS/BLUE SHIELD

## 2018-05-21 ENCOUNTER — Other Ambulatory Visit (HOSPITAL_COMMUNITY): Payer: Self-pay | Admitting: Obstetrics and Gynecology

## 2019-01-19 ENCOUNTER — Encounter (HOSPITAL_COMMUNITY): Payer: Self-pay

## 2019-06-11 IMAGING — US US OB COMP LESS 14 WK
2 series · 14 of 28 positions shown · non-contrast
Comparison: None

CLINICAL DATA: Vaginal bleeding for 1 day in early pregnancy

EXAM:
OBSTETRIC <14 WK US AND TRANSVAGINAL OB US
TECHNIQUE: Both transabdominal and transvaginal ultrasound examinations were
performed for complete evaluation of the gestation as well as the
maternal uterus, adnexal regions, and pelvic cul-de-sac.
Transvaginal technique was performed to assess early pregnancy.

[Series 1: us ob comp less 14 wk · 13 of 81 slices shown (1 of 2)]
[im 4/81]
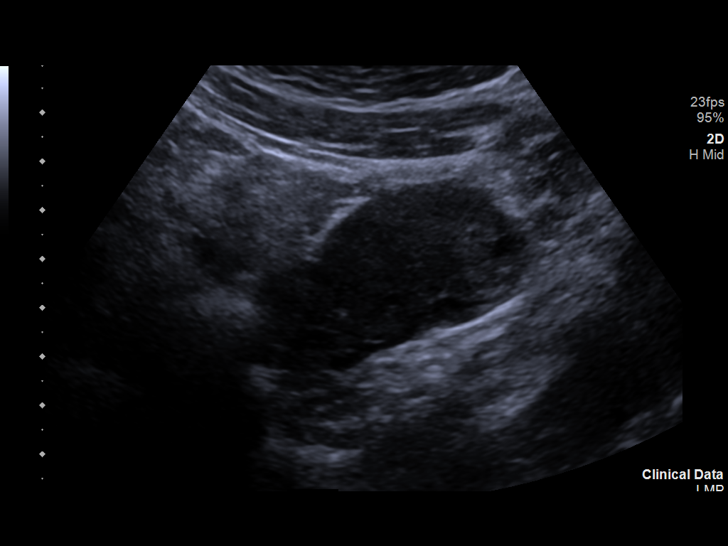
[im 10/81]
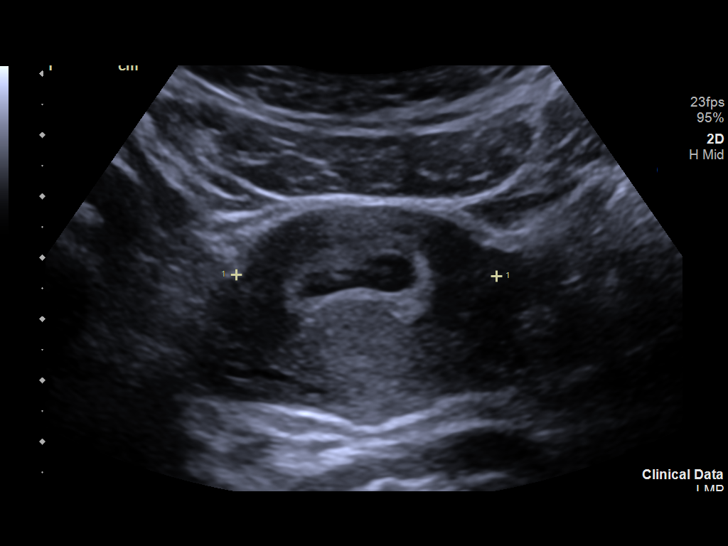
[im 16/81]
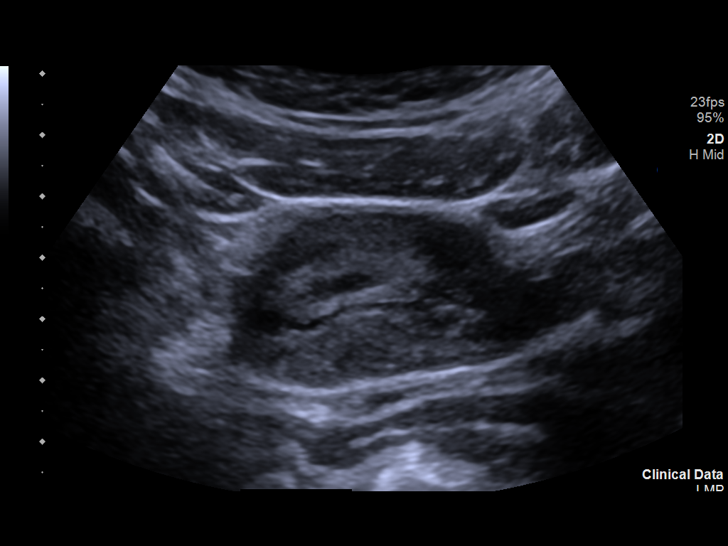
[im 22/81]
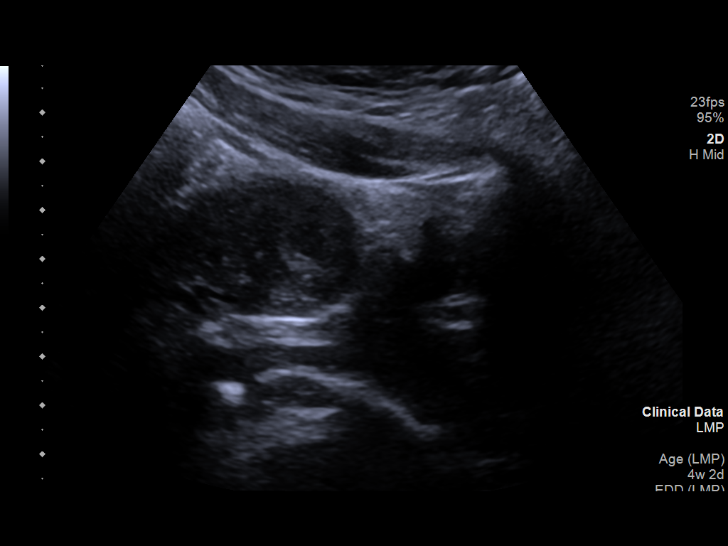
[im 28/81]
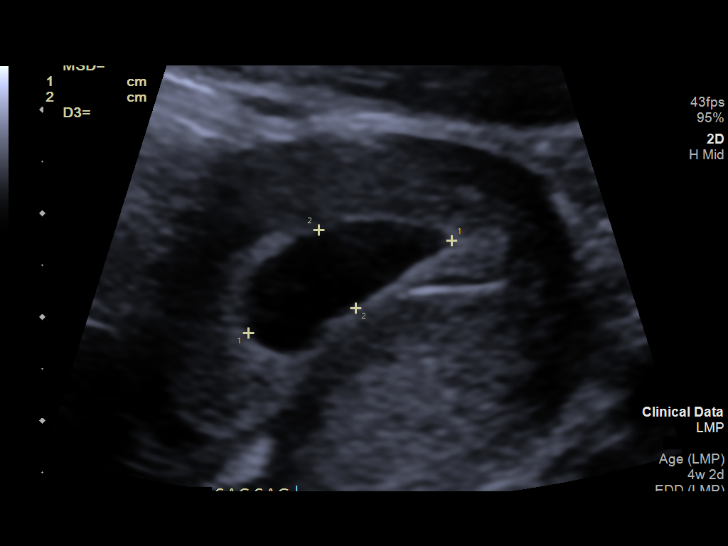
[im 34/81]
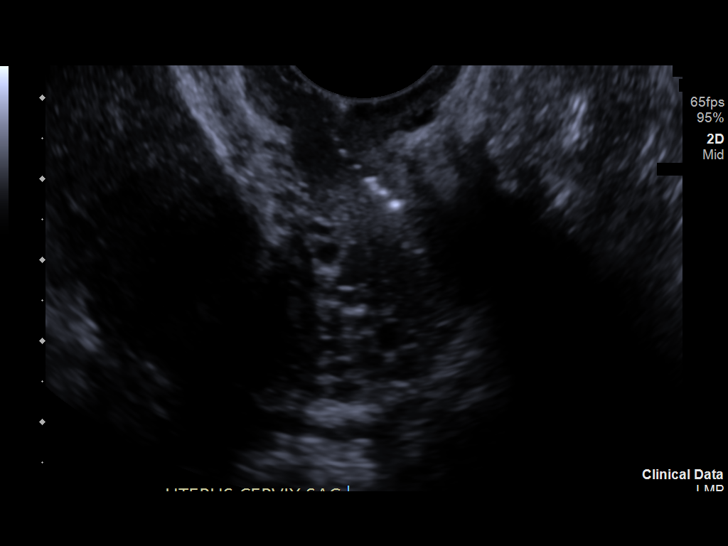
[im 41/81]
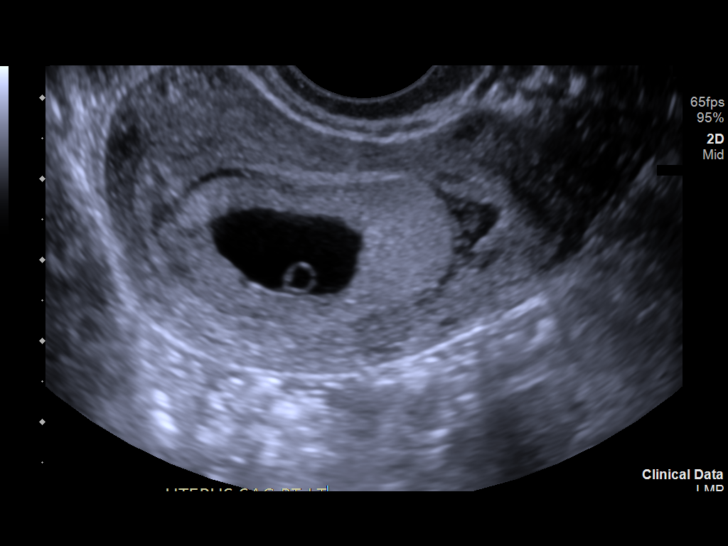
[im 47/81]
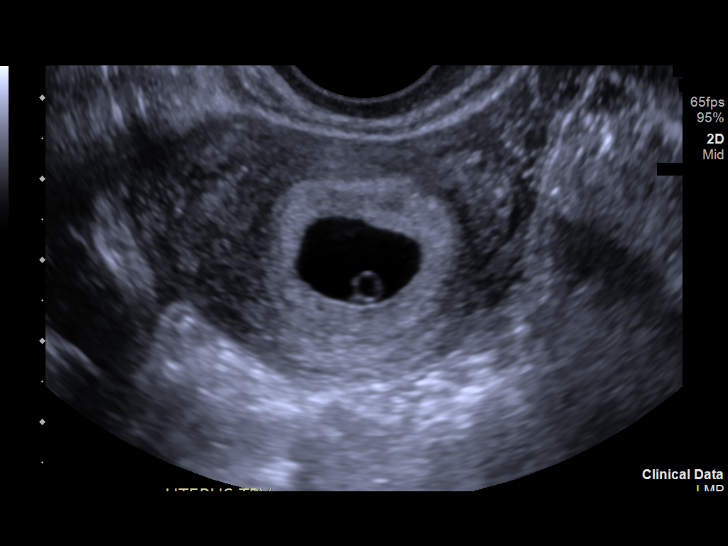
[im 53/81]
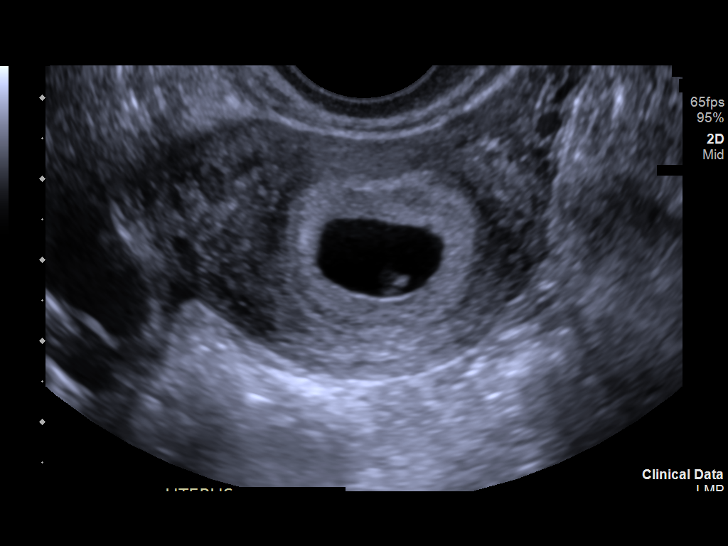
[im 59/81]
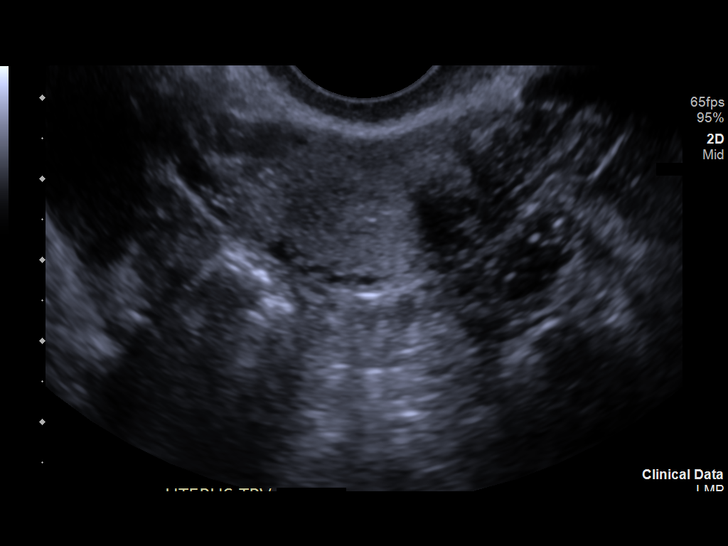
[im 65/81]
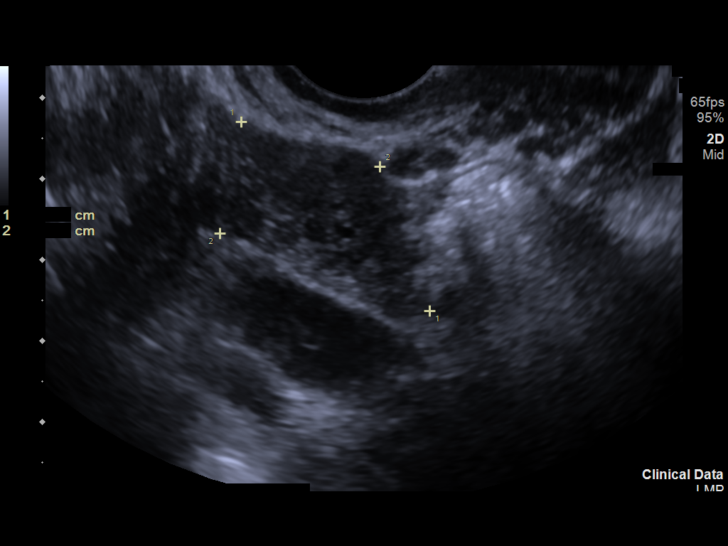
[im 71/81]
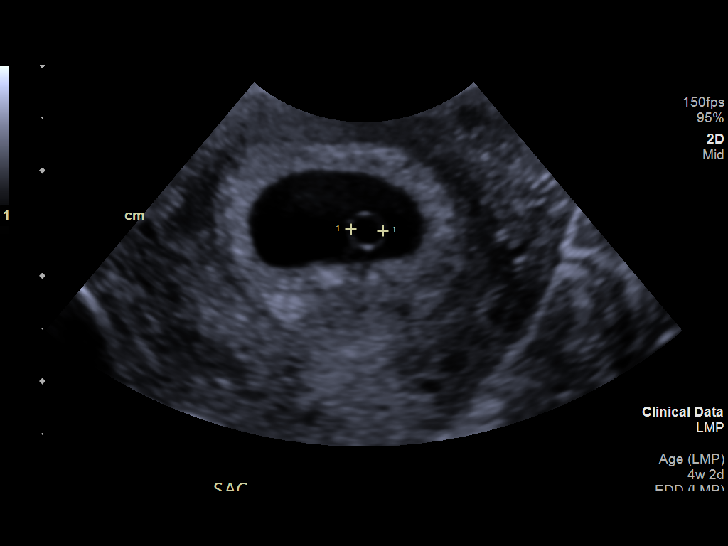
[im 77/81]
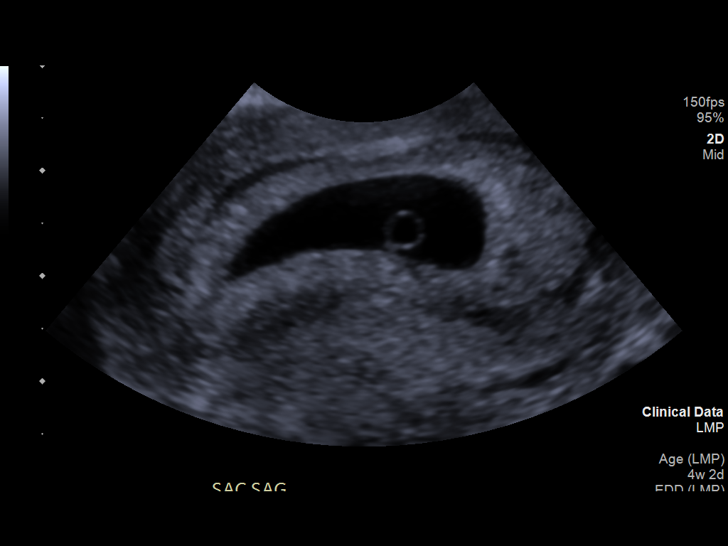

[Series 1001: us ob comp less 14 wk · 1 of 3 slices shown (2 of 2)]
[im 1/3]
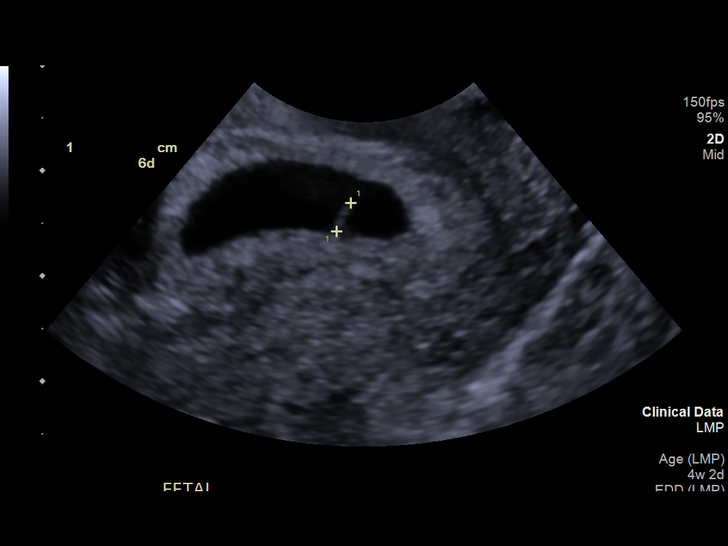

[14 of 28 positions shown; findings below may reference images not displayed]

FINDINGS: Intrauterine gestational sac: Present, single

Yolk sac:  Present

Embryo:  Present

Cardiac Activity: Present

Heart Rate: Adequate M-mode tracing of the fetal cardiac rate could
not be obtained

CRL:  3 mm   5 w   6 d                  US EDC: 11/07/2018

Subchorionic hemorrhage: Moderate to large subchronic hemorrhage
present

Maternal uterus/adnexae: Ovaries unremarkable. No free fluid. No
adnexal masses.
IMPRESSION: Single live intrauterine gestation is identified at 5 weeks 6 days
EGA by crown-rump length.

Moderate to large subchronic hemorrhage.
